# Patient Record
Sex: Female | Born: 2000 | Hispanic: Yes | Marital: Single | Smoking: Never smoker
Health system: Southern US, Community
[De-identification: ages and names within clinical notes are randomized; demographics above are authoritative.]

---
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
## 2019-06-13 IMAGING — US US PELVIS COMPLETE
1 series · 14 of 25 positions shown · non-contrast
Comparison: None.

CLINICAL DATA: Initial evaluation for acute abdominal pain for 3
months.

EXAM:
TRANSABDOMINAL ULTRASOUND OF PELVIS
DOPPLER ULTRASOUND OF OVARIES
TECHNIQUE: Transabdominal ultrasound examination of the pelvis was performed
including evaluation of the uterus, ovaries, adnexal regions, and
pelvic cul-de-sac.
Color and duplex Doppler ultrasound was utilized to evaluate blood
flow to the ovaries.
Both transabdominal and transvaginal ultrasound examinations of the
pelvis were performed.

[Series 1: us pelvis complete · 0.18mm/px · 14 of 43 slices shown]
[im 1/43]
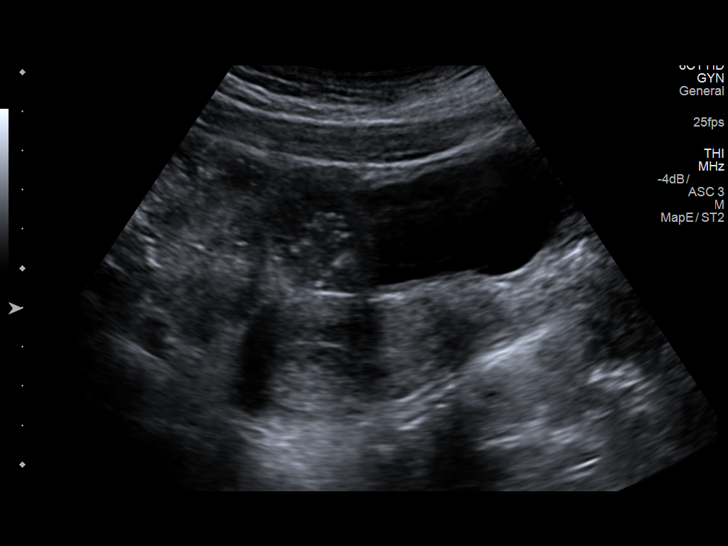
[im 4/43]
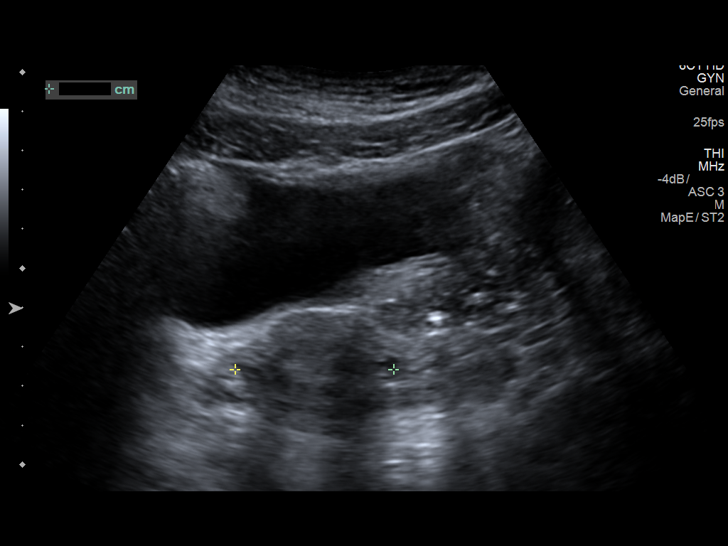
[im 8/43]
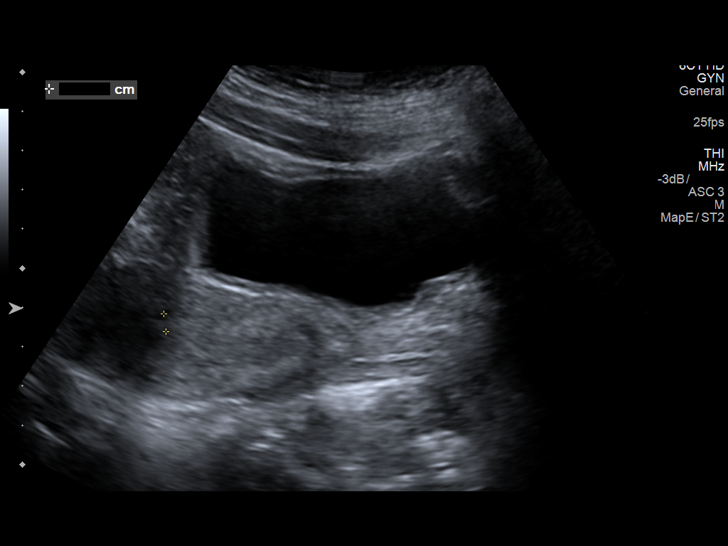
[im 11/43]
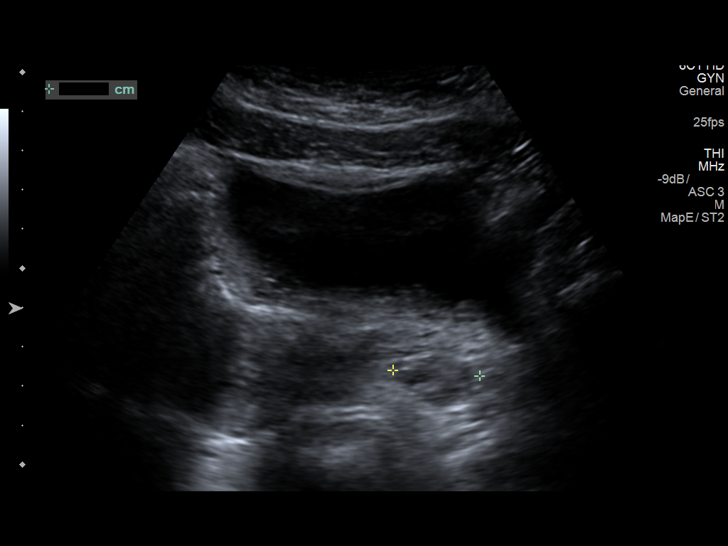
[im 15/43]
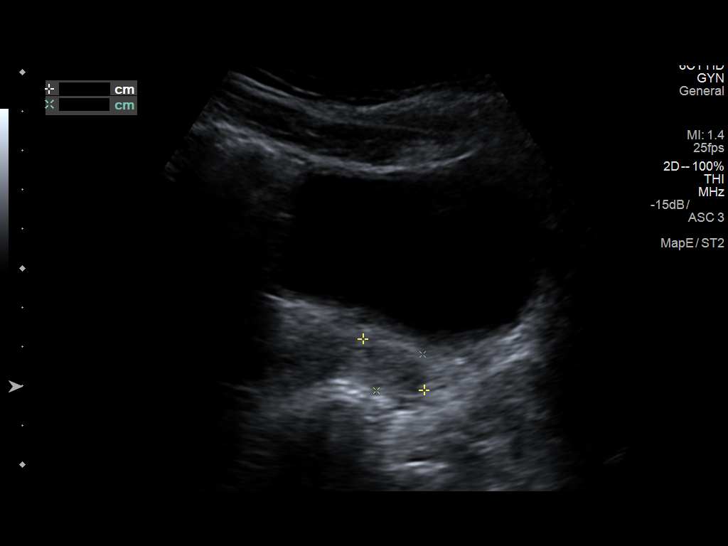
[im 16/43]
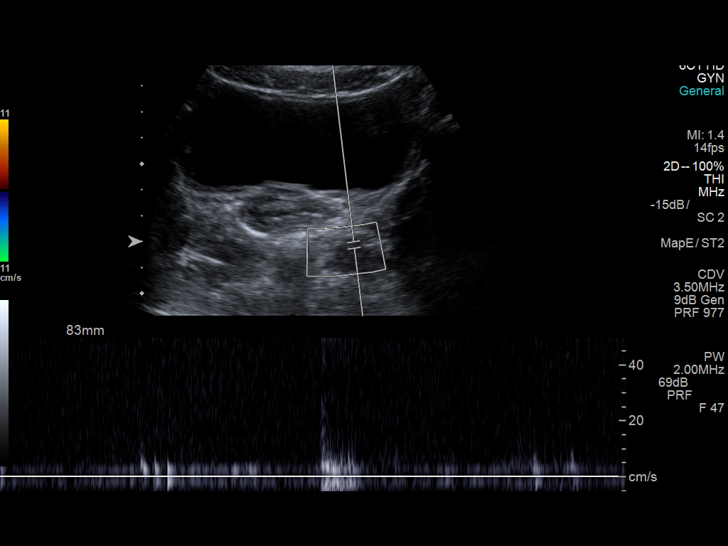
[im 20/43]
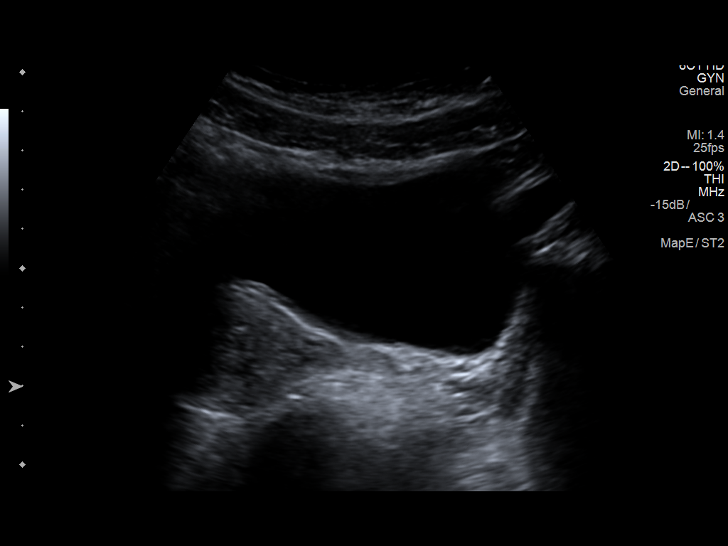
[im 23/43]
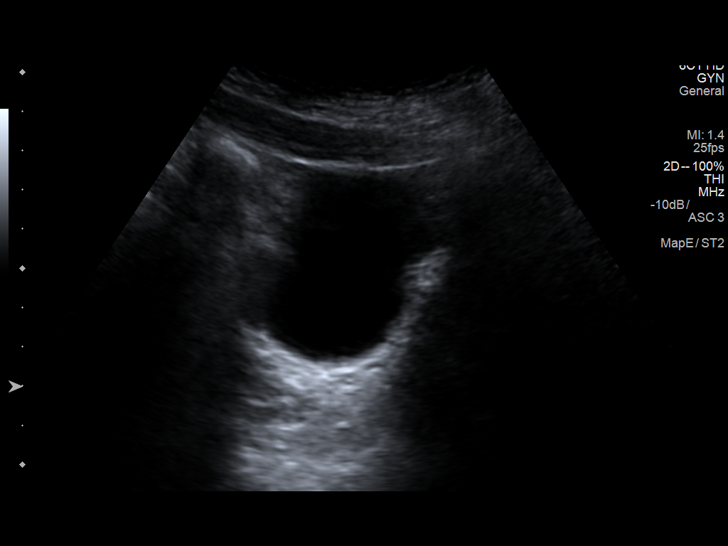
[im 27/43]
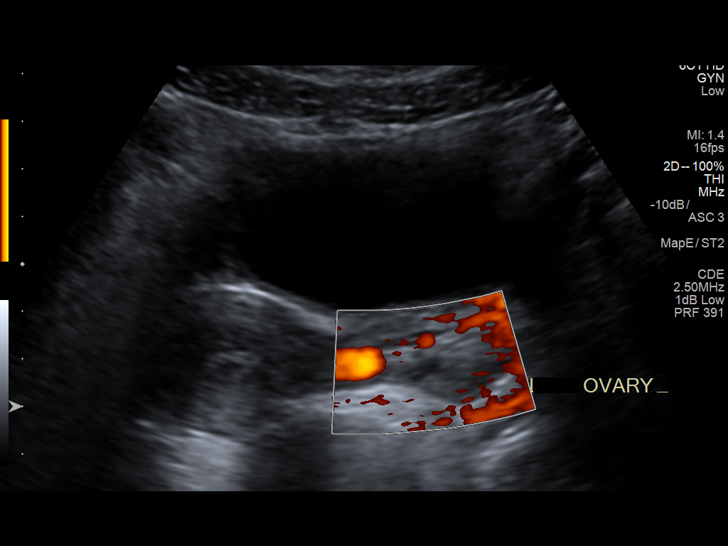
[im 29/43]
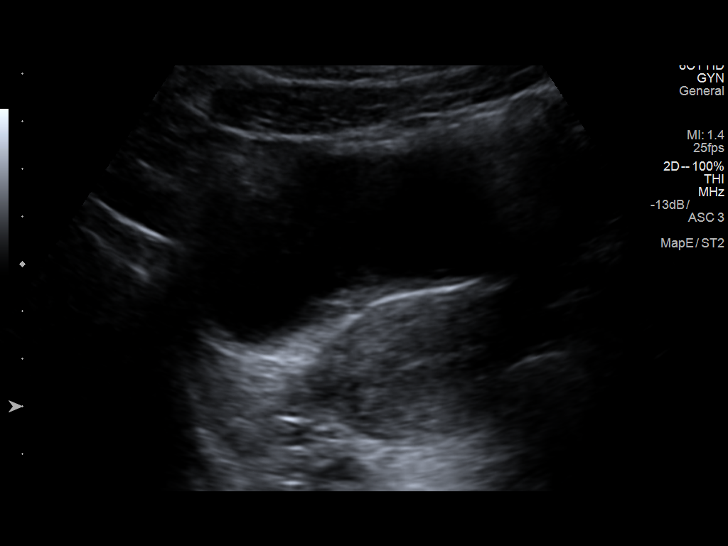
[im 32/43]
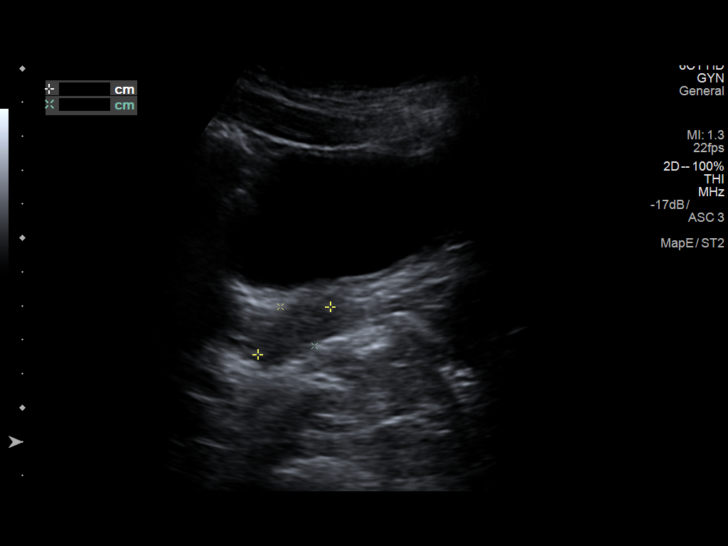
[im 36/43]
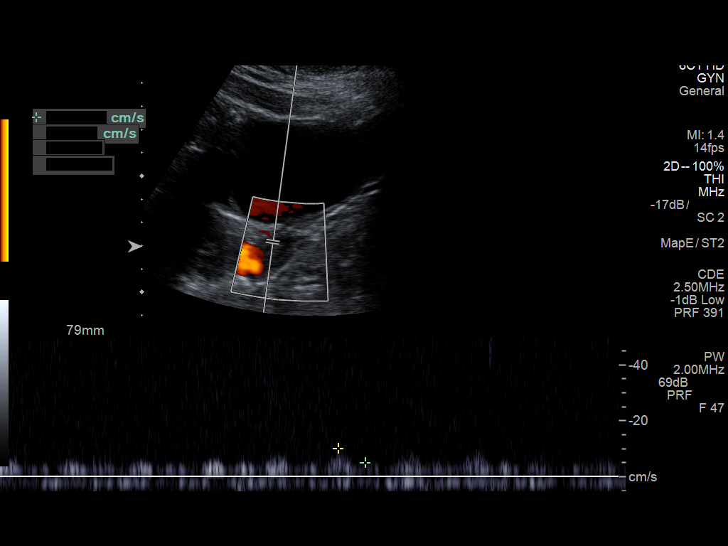
[im 39/43]
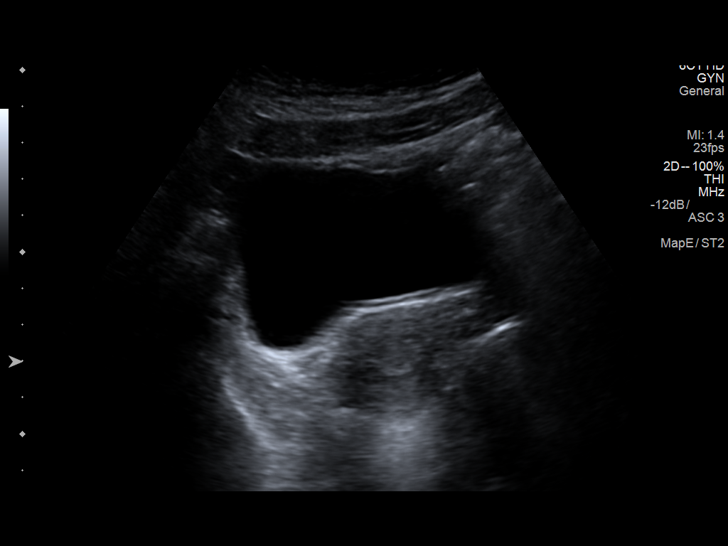
[im 43/43]
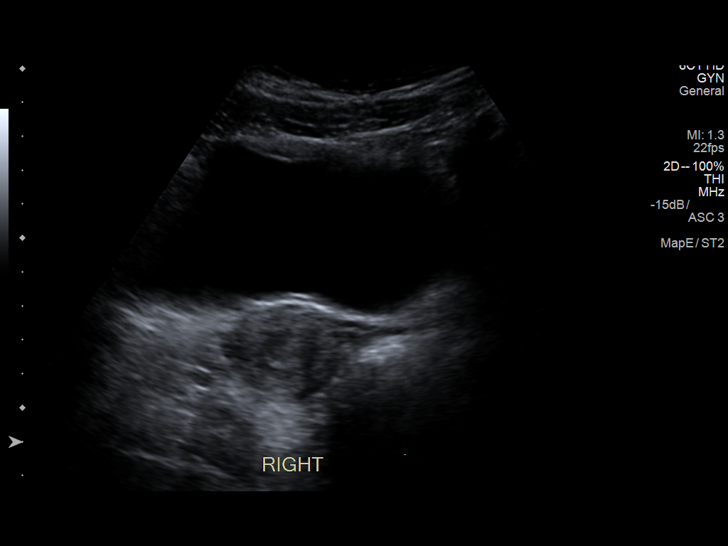

[14 of 25 positions shown; findings below may reference images not displayed]

FINDINGS: Uterus

Measurements: 6.6 x 3.5 x 4.0 cm. No fibroids or other mass
visualized.

Endometrium

Thickness: 4.6 mm. No focal abnormality visualized.

Right ovary

Measurements: 2.6 x 1.5 x 2.5 cm. Normal appearance/no adnexal mass.

Left ovary

Measurements: 2.0 x 1.5 x 2.2 cm. Normal appearance/no adnexal mass.

Pulsed Doppler evaluation demonstrates normal low-resistance
arterial and venous waveforms in both ovaries.
IMPRESSION: Normal pelvic ultrasound.  No acute abnormality identified.

## 2020-10-08 IMAGING — DX DG SHOULDER 2+V*L*
3 series · 3 of 3 positions shown · non-contrast
Comparison: None.

CLINICAL DATA: Left shoulder injury when the patient tried to catch
a falling [REDACTED].

EXAM:
LEFT SHOULDER - 2+ VIEW

[shoulder grashey]
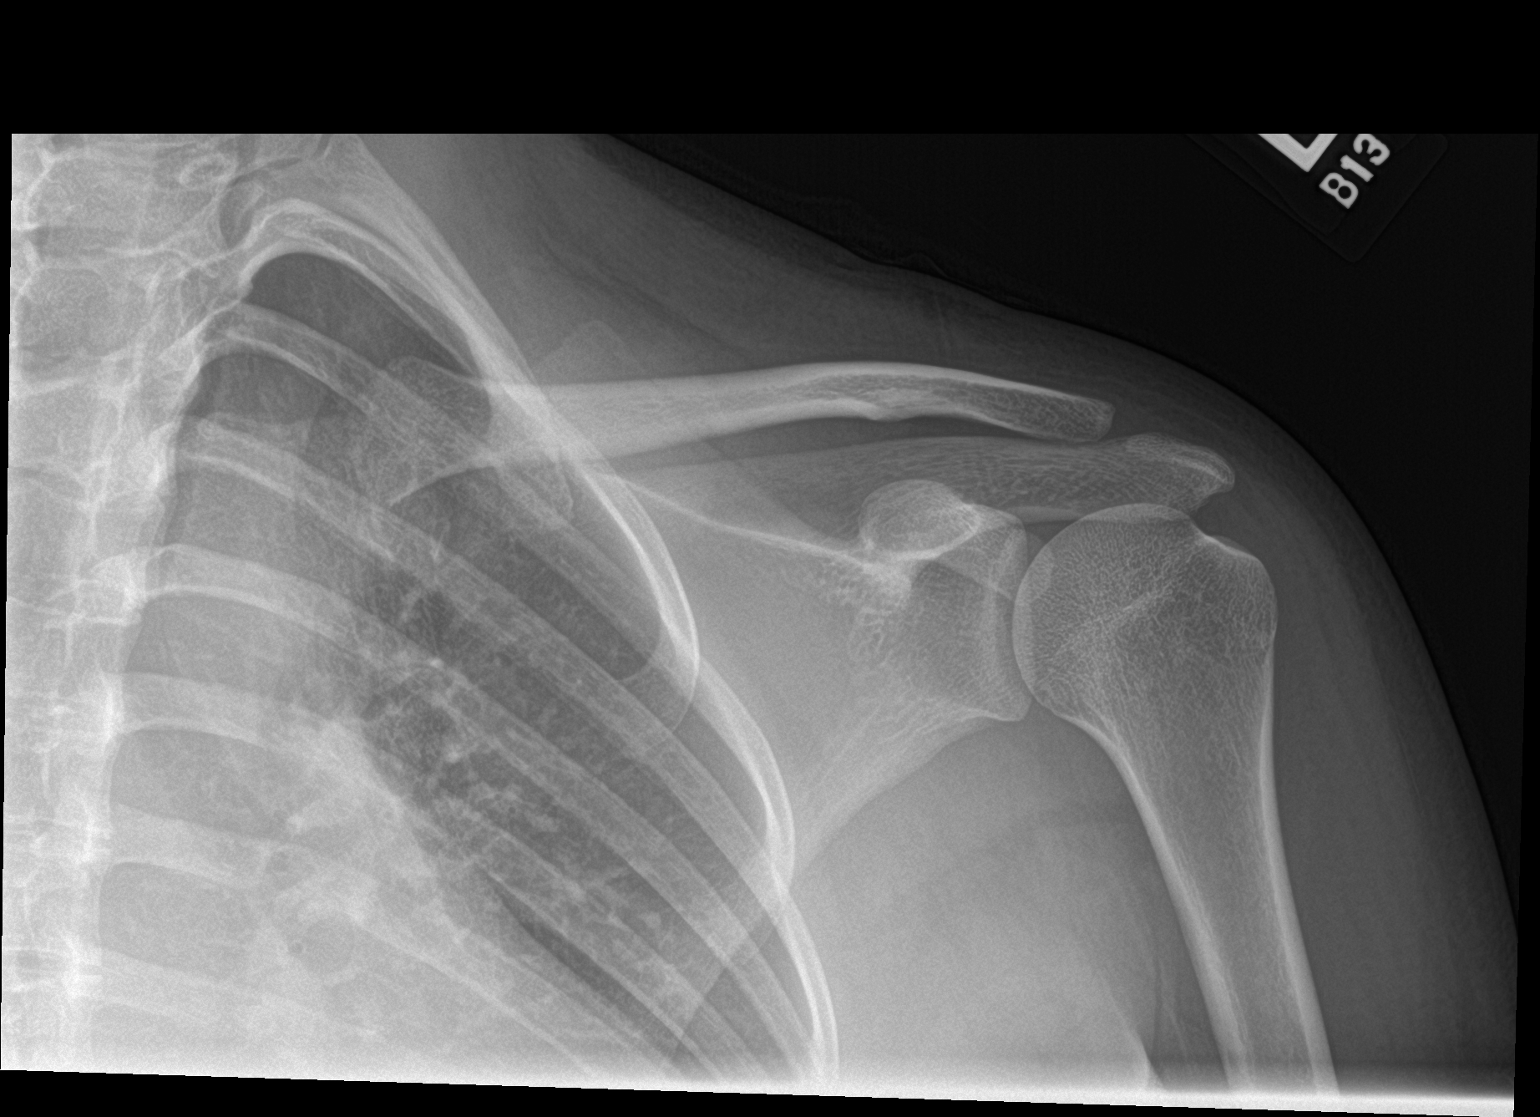

[shoulder y view]
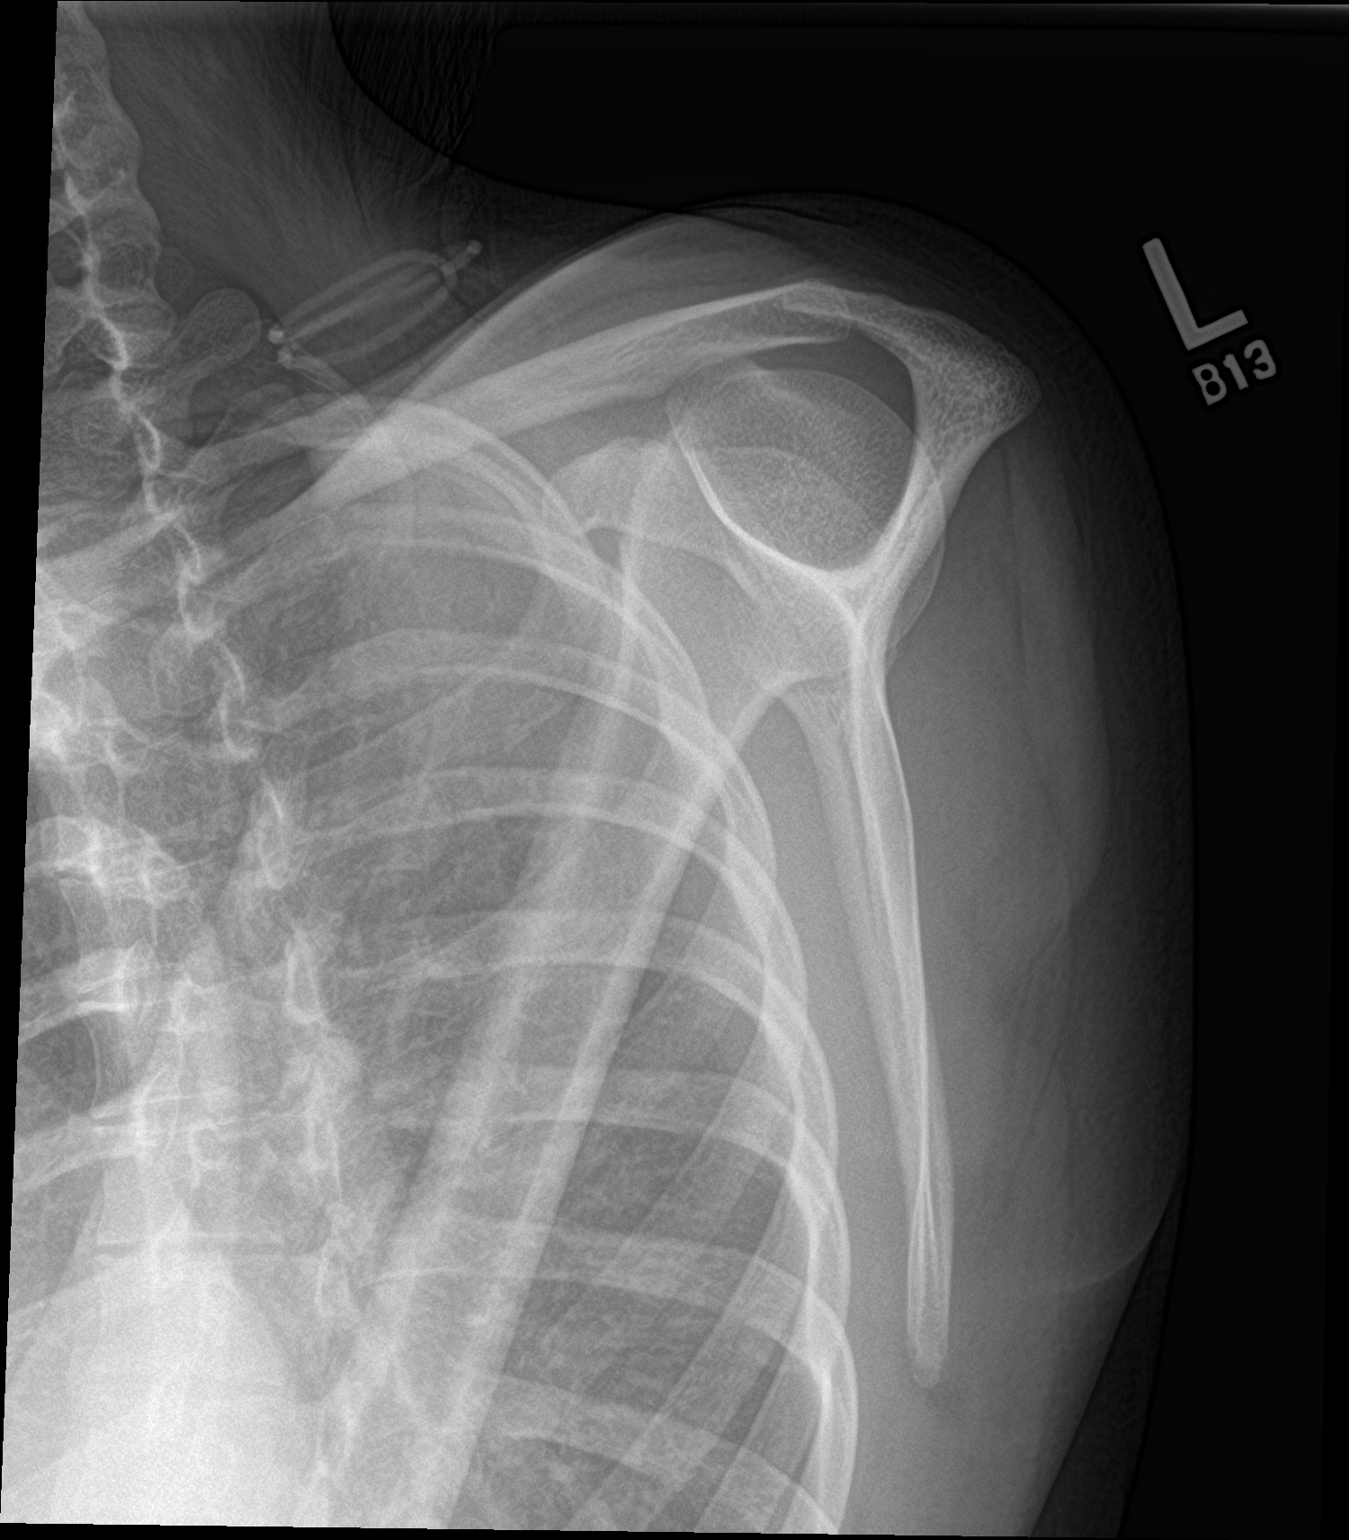

[shoulder axillary]
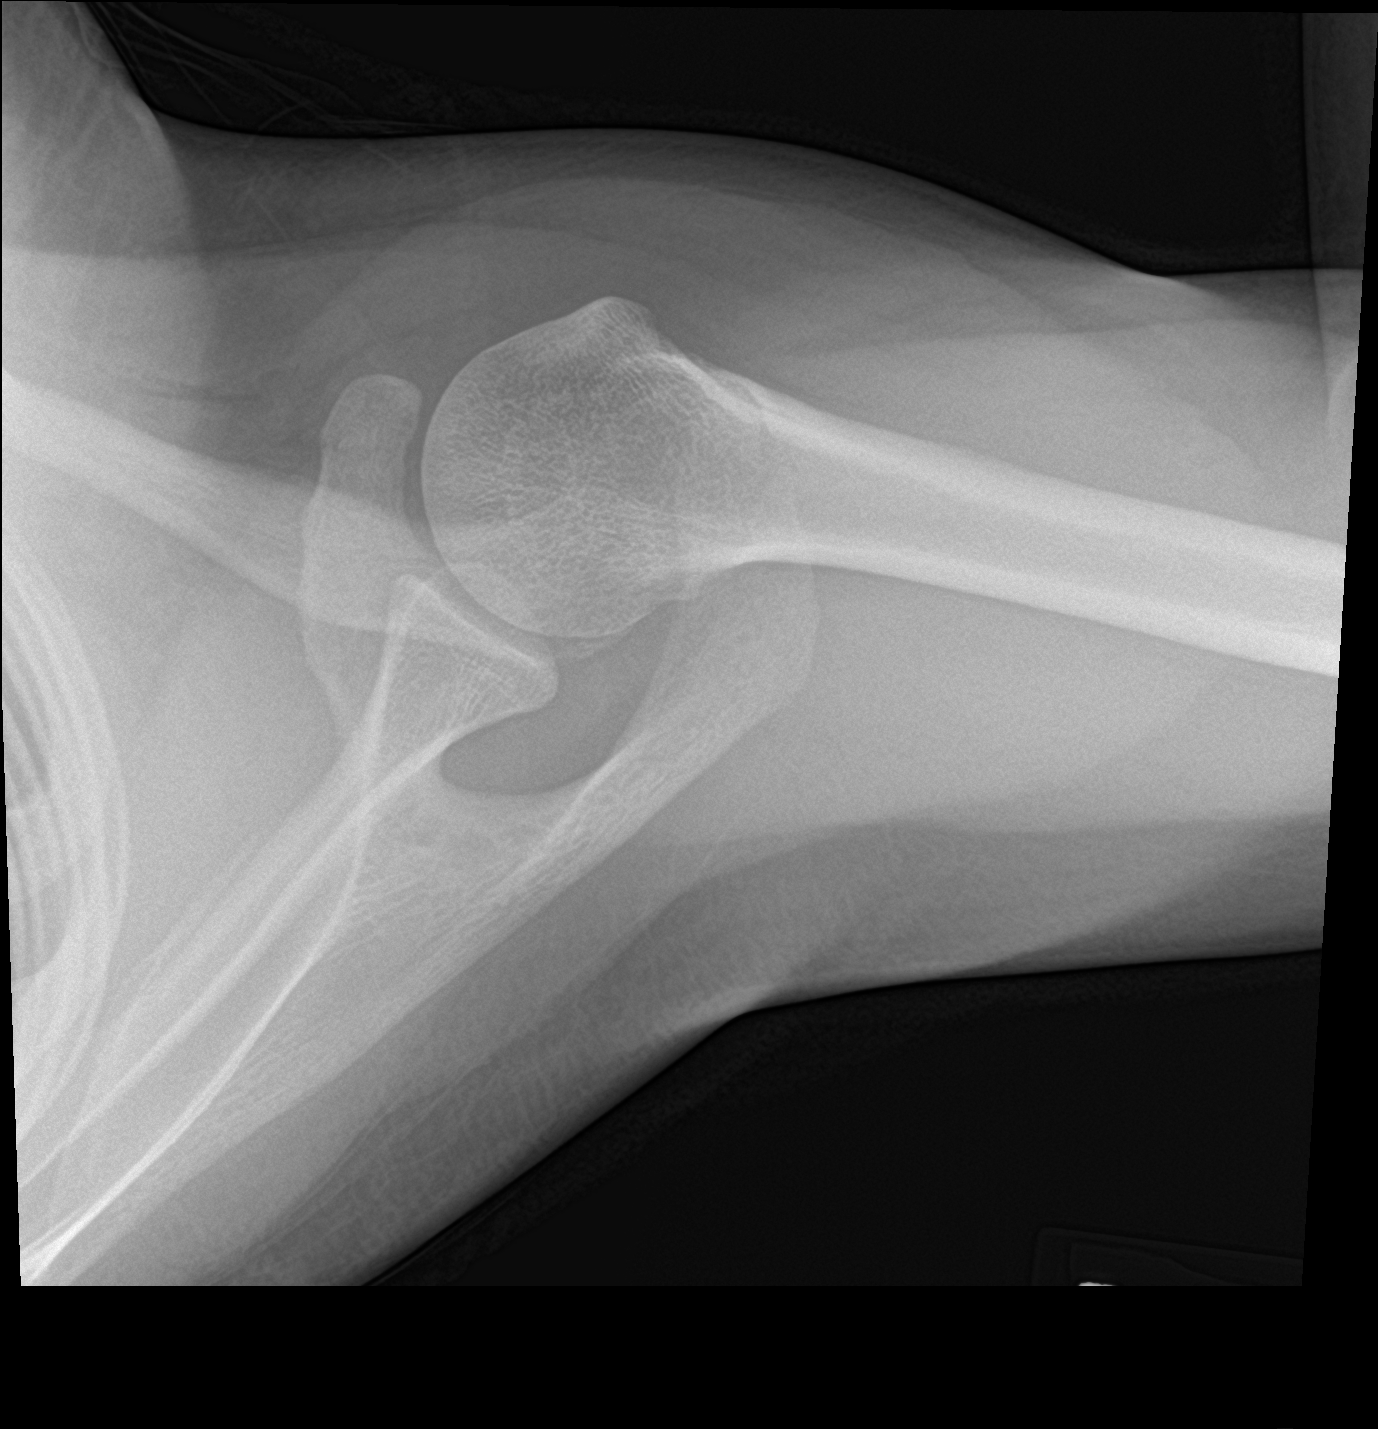

[3 of 3 positions shown; findings below may reference images not displayed]

FINDINGS: There is no evidence of fracture or dislocation. There is no
evidence of arthropathy or other focal bone abnormality. Soft
tissues are unremarkable.
IMPRESSION: Normal exam.
